# Patient Record
Sex: Female | Born: 2000 | Race: Black or African American | Hispanic: No | Marital: Single | Smoking: Never smoker
Health system: Southern US, Community
[De-identification: ages and names within clinical notes are randomized; demographics above are authoritative.]

---
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
## 2018-08-31 ENCOUNTER — Emergency Department (HOSPITAL_COMMUNITY)
Admission: EM | Admit: 2018-08-31 | Discharge: 2018-08-31 | Disposition: A | Discharge status: Home / Self Care | Payer: Self-pay | Attending: Emergency Medicine | Admitting: Emergency Medicine

## 2018-08-31 ENCOUNTER — Encounter (HOSPITAL_COMMUNITY): Payer: Self-pay | Admitting: Emergency Medicine

## 2018-08-31 ENCOUNTER — Other Ambulatory Visit: Payer: Self-pay

## 2018-08-31 ENCOUNTER — Emergency Department (HOSPITAL_COMMUNITY): Payer: Self-pay

## 2018-08-31 DIAGNOSIS — R590 Localized enlarged lymph nodes: Secondary | ICD-10-CM | POA: Insufficient documentation

## 2018-08-31 DIAGNOSIS — R509 Fever, unspecified: Secondary | ICD-10-CM | POA: Insufficient documentation

## 2018-08-31 DIAGNOSIS — J039 Acute tonsillitis, unspecified: Secondary | ICD-10-CM | POA: Insufficient documentation

## 2018-08-31 LAB — BASIC METABOLIC PANEL
Anion gap: 11 (ref 5–15)
BUN: 13 mg/dL (ref 6–20)
CO2: 19 mmol/L — ABNORMAL LOW (ref 22–32)
Calcium: 9.1 mg/dL (ref 8.9–10.3)
Chloride: 106 mmol/L (ref 98–111)
Creatinine, Ser: 0.97 mg/dL (ref 0.44–1.00)
GFR calc Af Amer: >60 mL/min (ref >60)
GFR calc non Af Amer: >60 mL/min (ref >60)
Glucose, Bld: 111 mg/dL — ABNORMAL HIGH (ref 70–99)
Potassium: 3.8 mmol/L (ref 3.5–5.1)
Sodium: 136 mmol/L (ref 135–145)

## 2018-08-31 LAB — CBC WITH DIFFERENTIAL/PLATELET
Abs Immature Granulocytes: 0.16 10*3/uL — ABNORMAL HIGH (ref 0.00–0.07)
Basophils Absolute: 0 10*3/uL (ref 0.0–0.1)
Basophils Relative: 0 %
Eosinophils Absolute: 0 10*3/uL (ref 0.0–0.5)
Eosinophils Relative: 0 %
HCT: 40.6 % (ref 36.0–46.0)
Hemoglobin: 13.3 g/dL (ref 12.0–15.0)
Immature Granulocytes: 1 %
Lymphocytes Relative: 5 %
Lymphs Abs: 0.7 10*3/uL (ref 0.7–4.0)
MCH: 29.8 pg (ref 26.0–34.0)
MCHC: 32.8 g/dL (ref 30.0–36.0)
MCV: 91 fL (ref 80.0–100.0)
Monocytes Absolute: 1.5 10*3/uL — ABNORMAL HIGH (ref 0.1–1.0)
Monocytes Relative: 10 %
Neutro Abs: 13.3 10*3/uL — ABNORMAL HIGH (ref 1.7–7.7)
Neutrophils Relative %: 84 %
Platelets: 215 10*3/uL (ref 150–400)
RBC: 4.46 MIL/uL (ref 3.87–5.11)
RDW: 11.9 % (ref 11.5–15.5)
WBC: 15.7 10*3/uL — ABNORMAL HIGH (ref 4.0–10.5)
nRBC: 0 % (ref 0.0–0.2)

## 2018-08-31 LAB — I-STAT BETA HCG BLOOD, ED (MC, WL, AP ONLY): I-stat hCG, quantitative: <5 m[IU]/mL (ref <5)

## 2018-08-31 LAB — GROUP A STREP BY PCR: Group A Strep by PCR: NOT DETECTED

## 2018-08-31 LAB — LACTIC ACID, PLASMA: Lactic Acid, Venous: 0.9 mmol/L (ref 0.5–1.9)

## 2018-08-31 MED ORDER — IOHEXOL 300 MG/ML  SOLN
75.0000 mL | Freq: Once | INTRAMUSCULAR | Status: AC
  Administered 2018-08-31: 11:00:00 75 mL via INTRAVENOUS

## 2018-08-31 MED ORDER — KETOROLAC TROMETHAMINE 30 MG/ML IJ SOLN
30.0000 mg | Freq: Once | INTRAMUSCULAR | Status: AC
  Administered 2018-08-31: 10:00:00 30 mg via INTRAVENOUS
  Filled 2018-08-31: qty 1

## 2018-08-31 MED ORDER — ACETAMINOPHEN 500 MG PO TABS
1000.0000 mg | ORAL_TABLET | Freq: Once | ORAL | Status: AC
  Administered 2018-08-31: 09:00:00 1000 mg via ORAL
  Filled 2018-08-31: qty 2

## 2018-08-31 MED ORDER — AMOXICILLIN 500 MG PO CAPS: 500.0000 mg | ORAL_CAPSULE | Freq: Two times a day (BID) | ORAL | 0 refills | Status: AC

## 2018-08-31 MED ORDER — DEXAMETHASONE SODIUM PHOSPHATE 10 MG/ML IJ SOLN
10.0000 mg | Freq: Once | INTRAMUSCULAR | Status: AC
  Administered 2018-08-31: 10:00:00 10 mg via INTRAVENOUS
  Filled 2018-08-31: qty 1

## 2018-08-31 MED ORDER — AMOXICILLIN 500 MG PO CAPS
500.0000 mg | ORAL_CAPSULE | Freq: Once | ORAL | Status: AC
  Administered 2018-08-31: 12:00:00 500 mg via ORAL
  Filled 2018-08-31: qty 1

## 2018-08-31 MED ORDER — SODIUM CHLORIDE 0.9 % IV BOLUS
1000.0000 mL | Freq: Once | INTRAVENOUS | Status: AC
  Administered 2018-08-31: 09:00:00 1000 mL via INTRAVENOUS

## 2018-08-31 MED ORDER — SODIUM CHLORIDE 0.9 % IV BOLUS
1000.0000 mL | Freq: Once | INTRAVENOUS | Status: AC
  Administered 2018-08-31: 10:00:00 1000 mL via INTRAVENOUS

## 2018-08-31 MED ORDER — AMOXICILLIN 500 MG PO CAPS: 500.0000 mg | ORAL_CAPSULE | Freq: Two times a day (BID) | ORAL | 0 refills | Status: DC

## 2018-08-31 NOTE — Discharge Instructions (Addendum)
As we discussed, your CT scan did not show any signs of abscess.  You have tonsillitis with some surrounding reactive swollen lymph nodes.  You can take Tylenol or Ibuprofen as directed for pain. You can alternate Tylenol and Ibuprofen every 4 hours. If you take Tylenol at 1pm, then you can take Ibuprofen at 5pm. Then you can take Tylenol again at 9pm.   Take antibiotics as directed. Please take all of your antibiotics until finished.  Make sure you drink plenty of fluids and stay hydrated.  Please follow-up with primary care doctor or come wellness clinic in 5 to 7 days.  If symptoms do not improve in the next 5 to 7 days, return the emergency department or return sooner for any difficulty breathing, vomiting, high fever despite medications or any other worsening or concerning symptoms.  Return the emergency department for any worsening pain, fever, difficulty breathing or any other worsening or concerning symptoms.

## 2018-08-31 NOTE — ED Provider Notes (Signed)
[facility] Note   CSN: [id number] Arrival date & time: 08/31/18  [id number]     History   Chief Complaint Chief Complaint  Patient presents with   Sore Throat    HPI Tracey Owens is a 18 y.o. female presents for evaluation of 2 days of sore throat.  She started noticing yesterday that she felt like her lymph nodes were swollen.  She states that this was followed by a sore throat.  She states she drank hot tea and took ibuprofen which temporarily relieved her symptoms but states that pain returned worse last night and this morning.  She has not taken any medication since yesterday.  She states that she is able to tolerate her secretions but does report pain when doing so.  She states that she has not been able to eat or drink much secondary to pain.  She has not had any trouble breathing or vomiting.  She states she has not measured her temperature so she does not know if she has had a fever.  He does attend college right now but states she has not had any known sick contacts.  She denies any recent travel or known COVID-19 exposure.  She denies any cough, difficulty breathing, chest pain.     The history is provided by the patient.    History reviewed. No pertinent past medical history.  There are no active problems to display for this patient.   History reviewed. No pertinent surgical history.   OB History   No obstetric history on file.      Home Medications    Prior to Admission medications   Medication Sig Start Date End Date Taking? Authorizing Provider  amoxicillin (AMOXIL) 500 MG capsule Take 1 capsule (500 mg total) by mouth 2 (two) times daily for 7 days. 08/31/18 09/07/18  Maxwell CaulLayden, Nester Bachus A, PA-C  doxycycline (VIBRAMYCIN) 100 MG capsule Take 1 capsule (100 mg total) by mouth 2 (two) times daily. 11/03/16   Roxy HorsemanBrowning, Robert, PA-C  ibuprofen (ADVIL,MOTRIN) 600 MG tablet Take 1 tablet (600 mg total) by mouth every 6 (six)  hours as needed. 08/10/17   Fayrene Helperran, Bowie, PA-C  penicillin v potassium (VEETID) 500 MG tablet Take 1 tablet (500 mg total) by mouth 3 (three) times daily. 08/10/17   Fayrene Helperran, Bowie, PA-C    Family History No family history on file.  Social History Social History   Tobacco Use   Smoking status: Never Smoker   Smokeless tobacco: Never Used  Substance Use Topics   Alcohol use: No   Drug use: No     Allergies   Patient has no known allergies.   Review of Systems Review of Systems  Constitutional: Positive for fever.  HENT: Positive for sore throat. Negative for drooling.   Respiratory: Negative for cough and shortness of breath.   Cardiovascular: Negative for chest pain.  Gastrointestinal: Negative for vomiting.  All other systems reviewed and are negative.    Physical Exam Updated Vital Signs BP 100/64 (BP Location: Left Arm)    Pulse 72    Temp 98.2 F (36.8 C) (Oral)    Resp 14    Ht 5\' 6"  (1.676 m)    Wt 64.9 kg    LMP 08/31/2018 (Exact Date)    SpO2 100%    BMI 23.08 kg/m   Physical Exam Vitals signs and nursing note reviewed.  Constitutional:      Appearance: She is well-developed.  HENT:  Head: Normocephalic and atraumatic.     Mouth/Throat:     Comments: Posterior oropharynx with erythema, edema as well as exudates noted on bilateral tonsils.  Edema noted to bilateral tonsils.  They are not quite kissing but are very swollen.  Uvula appears midline.  No active drooling.  Airway is patent.  She does have some slightly muffled sounds though not quite hot potato voice. Eyes:     General: No scleral icterus.       Right eye: No discharge.        Left eye: No discharge.     Conjunctiva/sclera: Conjunctivae normal.  Pulmonary:     Effort: Pulmonary effort is normal.     Comments: Lungs clear to auscultation bilaterally.  Symmetric chest rise.  No wheezing, rales, rhonchi. Lymphadenopathy:     Cervical: Cervical adenopathy present.  Skin:    General: Skin is  warm and dry.  Neurological:     Mental Status: She is alert.  Psychiatric:        Speech: Speech normal.        Behavior: Behavior normal.      ED Treatments / Results  Labs (all labs ordered are listed, but only abnormal results are displayed) Labs Reviewed  BASIC METABOLIC PANEL - Abnormal; Notable for the following components:      Result Value   CO2 19 (*)    Glucose, Bld 111 (*)    All other components within normal limits  CBC WITH DIFFERENTIAL/PLATELET - Abnormal; Notable for the following components:   WBC 15.7 (*)    Neutro Abs 13.3 (*)    Monocytes Absolute 1.5 (*)    Abs Immature Granulocytes 0.16 (*)    All other components within normal limits  GROUP A STREP BY PCR  LACTIC ACID, PLASMA  I-STAT BETA HCG BLOOD, ED ([facility], [facility], [facility] ONLY)    EKG None  Radiology Ct Soft Tissue Neck W Contrast  Result Date: 08/31/2018 CLINICAL DATA:  Sore throat/stridor, epiglottitis or tonsillitis suspected. Additional history: Swollen lymph nodes in neck with difficulty swallowing today. EXAM: CT NECK WITH CONTRAST TECHNIQUE: Multidetector CT imaging of the neck was performed using the standard protocol following the bolus administration of intravenous contrast. CONTRAST:  45mL OMNIPAQUE IOHEXOL 300 MG/ML  SOLN COMPARISON:  No pertinent prior studies available for comparison. FINDINGS: Pharynx and larynx: The bilateral palatine tonsils are enlarged and heterogeneously enhancing, findings compatible with tonsillitis. No evidence of organized peritonsillar abscess at this time. There is mild-to-moderate narrowing of the oropharyngeal airway. The larynx is unremarkable. Salivary glands: No evidence of inflammation, mass or stone. Thyroid: Unremarkable Lymph nodes: There is bilateral cervical lymphadenopathy. Index left level II lymph nodes measure up to 1.7 cm in short axis. Index right level 2 lymph nodes measure up to 2.1 cm in short axis. Vascular: The carotid and vertebral arteries are  patent within the neck without high-grade stenosis. Limited intracranial: Unremarkable Visualized orbits: The imaged globes and orbits are unremarkable. Mastoids and visualized paranasal sinuses: No significant paranasal sinus disease or mastoid effusion. Skeleton: No acute bony abnormality. Upper chest: The imaged lung apices are clear. IMPRESSION: The bilateral palatine tonsils are enlarged and heterogeneously enhancing, consistent with tonsillitis. Resultant mild to moderate narrowing of the oropharyngeal airway. No evidence of organized peritonsillar abscess at this time. Prominent bilateral cervical lymphadenopathy may be reactive. Clinical follow-up is recommended to ensure resolution and exclude alternative etiologies. Electronically Signed   By: Kellie Simmering   On: 08/31/2018 11:49  Procedures Procedures (including critical care time)  Medications Ordered in ED Medications  sodium chloride 0.9 % bolus 1,000 mL (0 mLs Intravenous Stopped 08/31/18 0942)  acetaminophen (TYLENOL) tablet 1,000 mg (1,000 mg Oral Given 08/31/18 0843)  dexamethasone (DECADRON) injection 10 mg (10 mg Intravenous Given 08/31/18 0953)  ketorolac (TORADOL) 30 MG/ML injection 30 mg (30 mg Intravenous Given 08/31/18 0953)  sodium chloride 0.9 % bolus 1,000 mL (0 mLs Intravenous Stopped 08/31/18 1205)  iohexol (OMNIPAQUE) 300 MG/ML solution 75 mL (75 mLs Intravenous Contrast Given 08/31/18 1108)  amoxicillin (AMOXIL) capsule 500 mg (500 mg Oral Given 08/31/18 1211)     Initial Impression / Assessment and Plan / ED Course  I have reviewed the triage vital signs and the nursing notes.  Pertinent labs & imaging results that were available during my care of the patient were reviewed by me and considered in my medical decision making (see chart for details).        18 y.o. female who presents for evaluation of 2 days of sore throat.  Reports pain with swallowing but able to tolerate secretions.  No vomiting, difficulty  breathing.  On initial ED arrival, she is febrile, tachycardic.  Vitals otherwise stable.  On exam, she does have some cervical lymphadenopathy as well as some erythema, edema, exudates noted posterior oropharynx.  Bilateral tonsils are swollen and are not quite touching.  She does have some muffled voices but not quite hot potato voice.  Question pharyngitis versus early peritonsillar abscess.  I-stat beta negative.  BMP is unremarkable.  Lactic acid is normal.  Strep is negative.  CBC shows leukocytosis of 15.  CT scan shows bilateral palatine tonsils enlarged consistent with tonsillitis.  No evidence of peritonsillar abscess at this time.  Discussed results with patient.  She reports feeling better.  Voice sounds less muffled.  Patient with no trouble breathing.  Vitals are stable.  Plan to p.o. challenge.  Patient able to tolerate p.o. without any difficulty.  Given fever, extensive plan to treat with amoxicillin.  Patient no known drug allergies. At this time, patient exhibits no emergent life-threatening condition that require further evaluation in ED or admission. Patient had ample opportunity for questions and discussion. All patient's questions were answered with full understanding. Strict return precautions discussed. Patient expresses understanding and agreement to plan.    Portions of this note were generated with Scientist, clinical (histocompatibility and immunogenetics)Dragon dictation software. Dictation errors may occur despite best attempts at proofreading.   Final Clinical Impressions(s) / ED Diagnoses   Final diagnoses:  Tonsillitis    ED Discharge Orders         Ordered    amoxicillin (AMOXIL) 500 MG capsule  2 times daily,   Status:  Discontinued     08/31/18 1205    amoxicillin (AMOXIL) 500 MG capsule  2 times daily     08/31/18 1207           Maxwell CaulLayden, Draper Gallon A, PA-C 08/31/18 1501    Sabas SousBero, Michael M, MD 09/03/18 1106

## 2018-08-31 NOTE — ED Triage Notes (Signed)
Pt states her throat started hurting yesterday. This morning she woke up and it was very painful to swallow. Redness and white pockets noted on back of throat. Pt took Ibuprofen yesterday with no relief. Pain 7/10

## 2018-08-31 NOTE — ED Notes (Signed)
Pt transported to CT ?

## 2018-08-31 NOTE — ED Notes (Signed)
Patient verbalizes understanding of discharge instructions. Opportunity for questioning and answers were provided. Pt discharged from ED. 

## 2020-08-12 IMAGING — CT CT NECK WITH CONTRAST
4 of 5 series · 16 of 33 positions shown, 18 images · IV contrast (omnipaque)
Comparison: No pertinent prior studies available for comparison.

CLINICAL DATA: Sore throat/stridor, epiglottitis or tonsillitis
suspected. Additional history: Swollen lymph nodes in neck with
difficulty swallowing today.

EXAM:
CT NECK WITH CONTRAST
TECHNIQUE: Multidetector CT imaging of the neck was performed using the
standard protocol following the bolus administration of intravenous
contrast.
CONTRAST:  75mL OMNIPAQUE IOHEXOL 300 MG/ML  SOLN

[Series 3: neck 2.0 st · axial · 0.48mm/px · z∈[-228,-72]mm · 4 of 130 slices shown, 5 images (1 of 3)]
[im 26/130  soft-tissue]
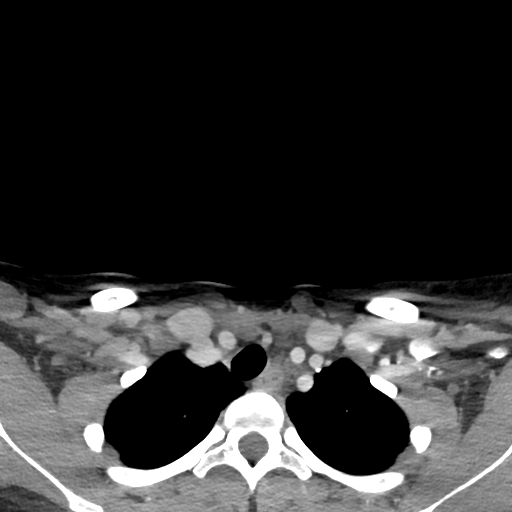
[im 26/130  bone]
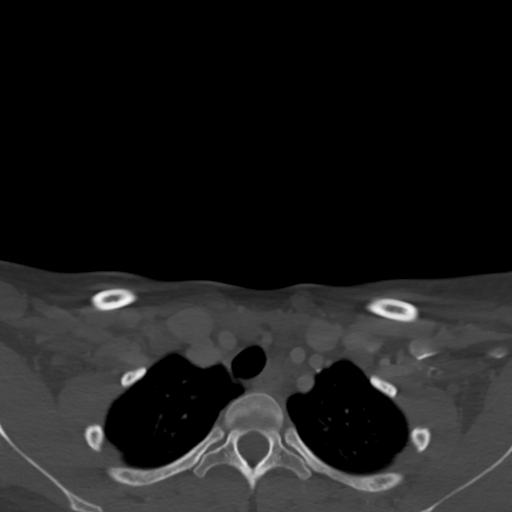
[im 52/130  bone]
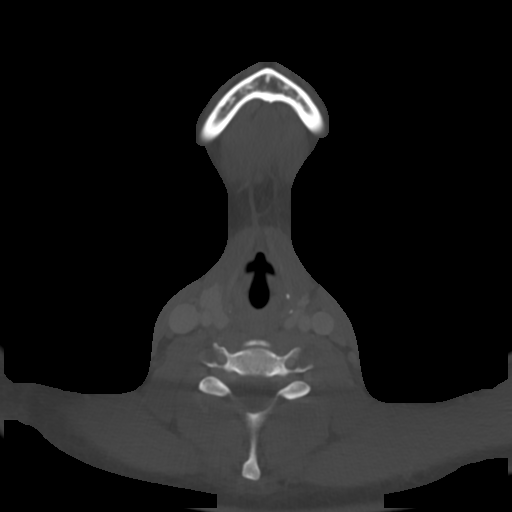
[im 78/130  bone]
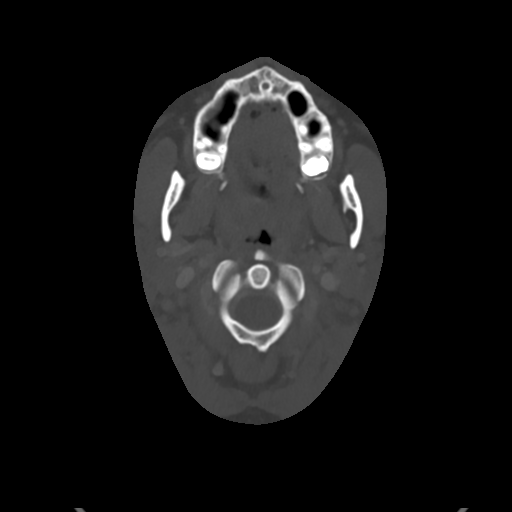
[im 104/130  bone]
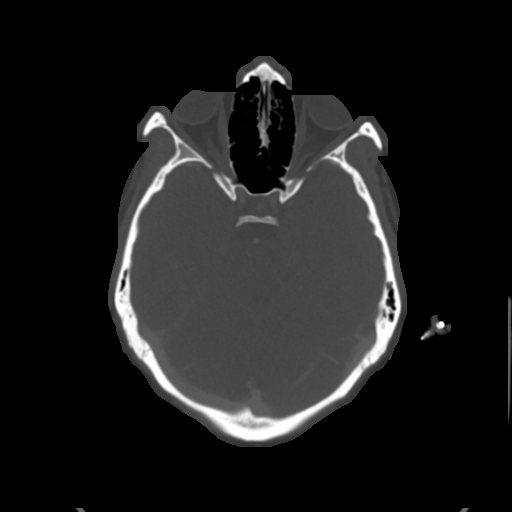

[Series 5: neck 2.0 st · sagittal · 0.51mm/px · 5 of 101 slices shown, 6 images (2 of 3)]
[im 34/101  bone]
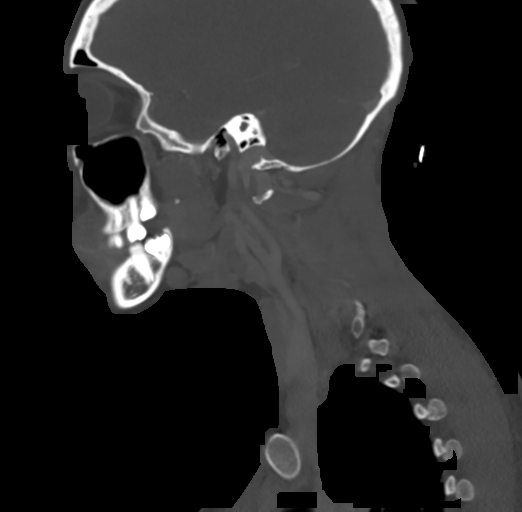
[im 42/101  bone]
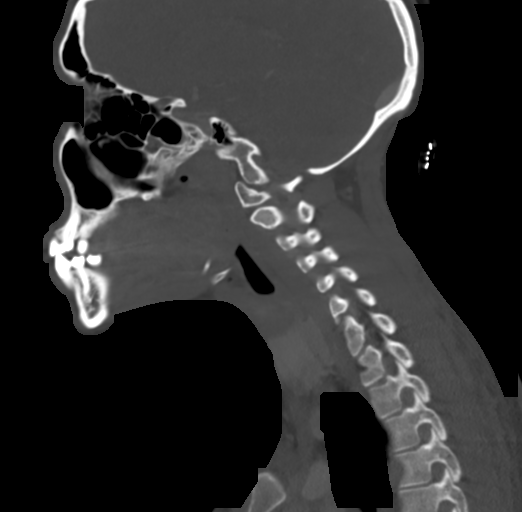
[im 51/101  soft-tissue]
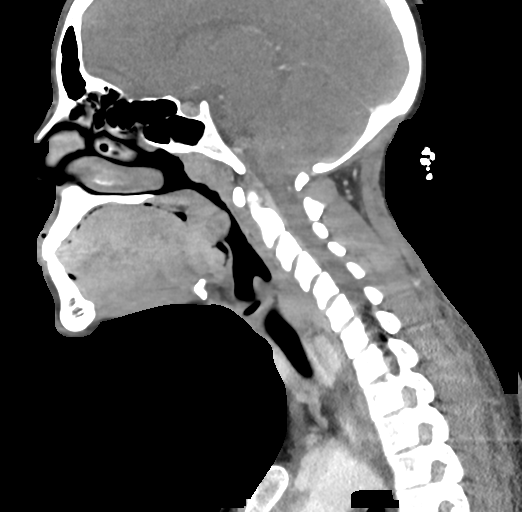
[im 51/101  bone]
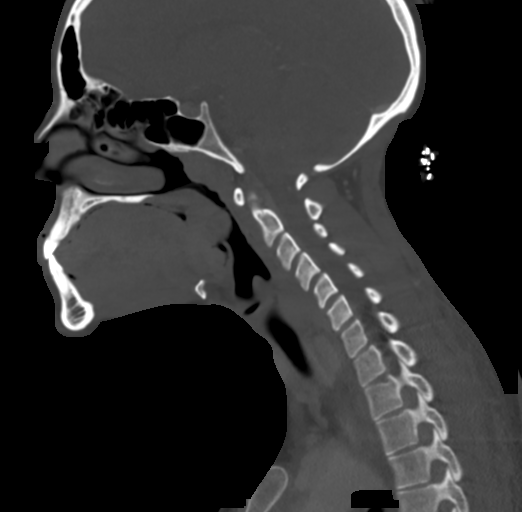
[im 59/101  bone]
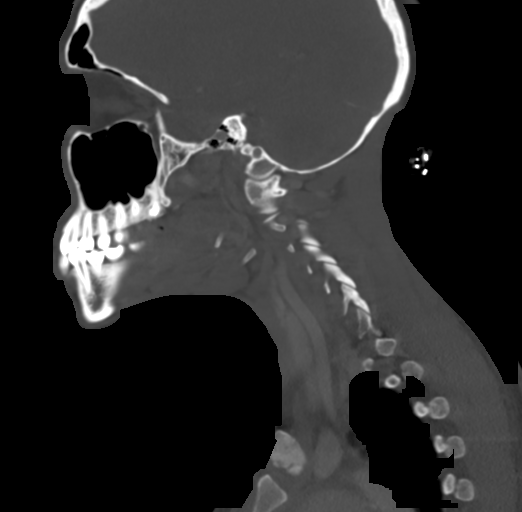
[im 67/101  bone]
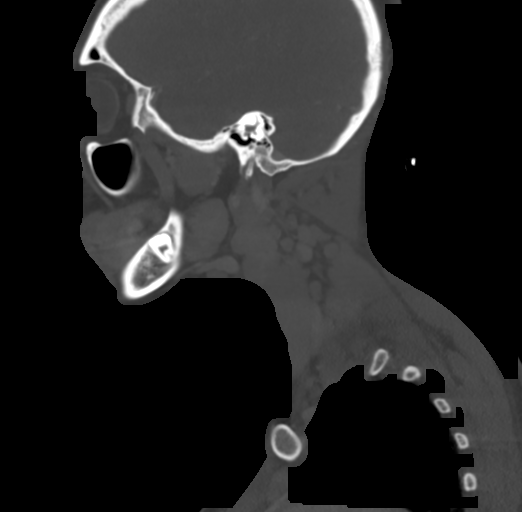

[Series 6: neck 2.0 st · coronal · 0.51mm/px · 3 of 126 slices shown (3 of 3)]
[im 26/126  bone]
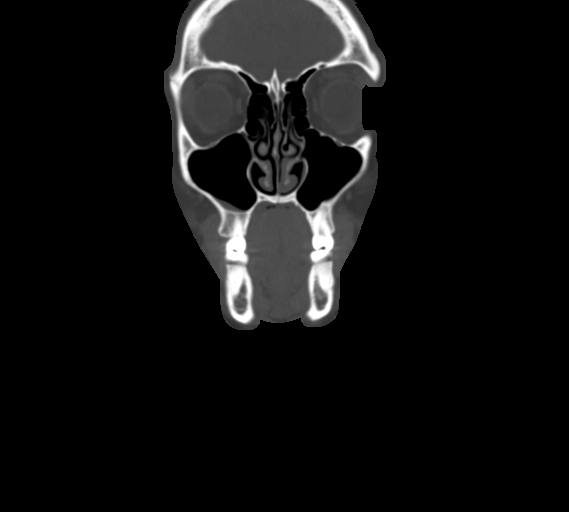
[im 51/126  bone]
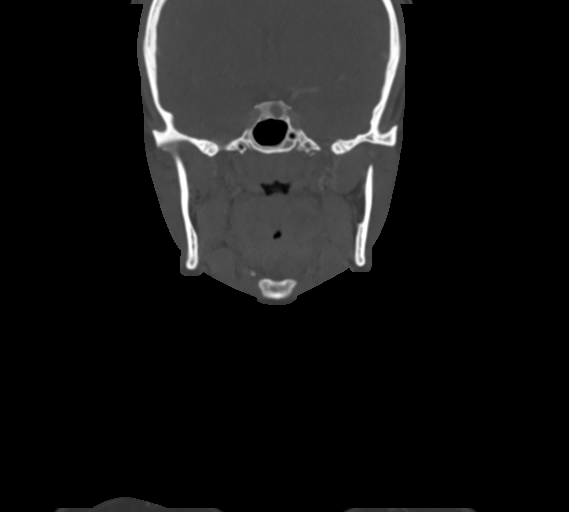
[im 76/126  bone]
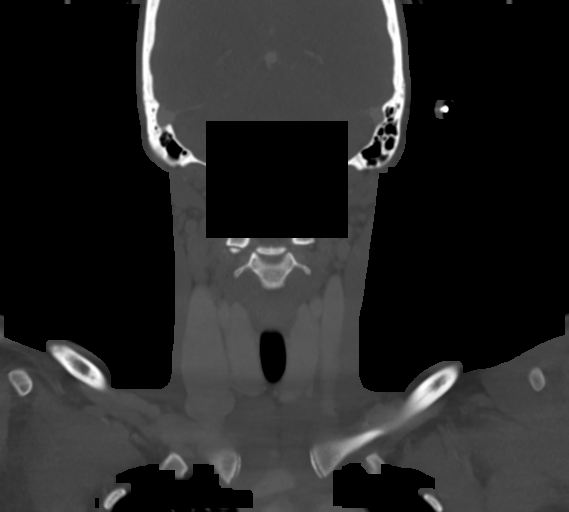

[Series 7: neck 2.0 st orthogonal · axial · 0.39mm/px · z∈[-228,-72]mm · 4 of 130 slices shown]
[im 26/130  bone]
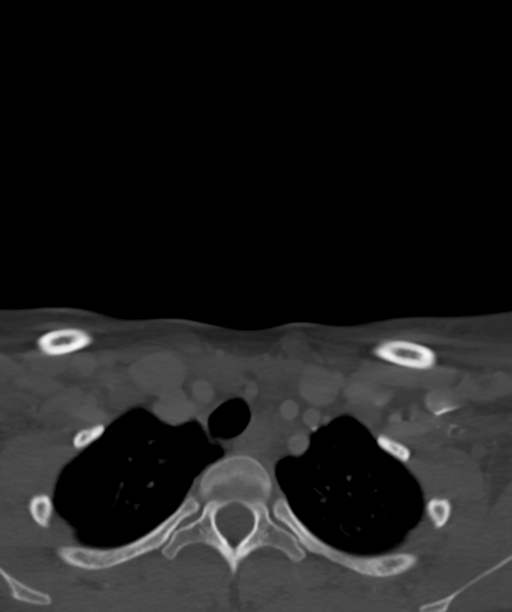
[im 52/130  bone]
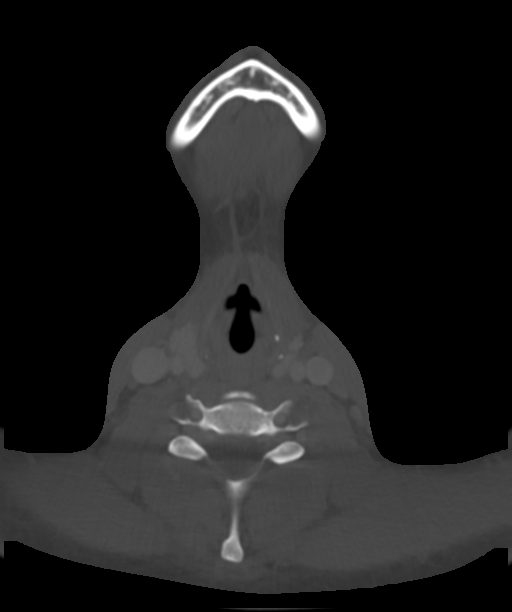
[im 78/130  bone]
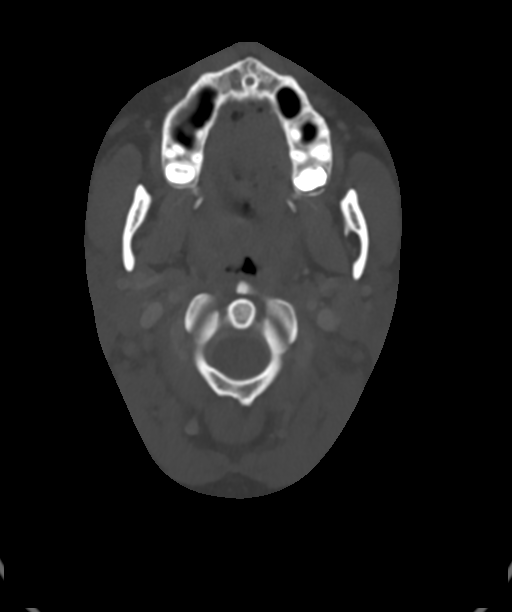
[im 104/130  bone]
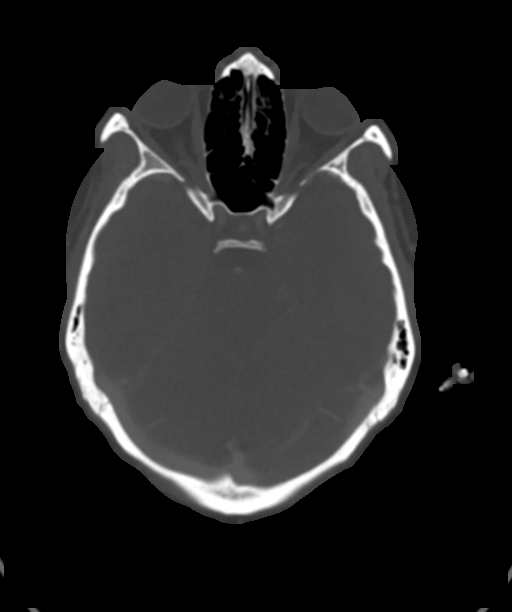

[16 of 33 positions shown; findings below may reference images not displayed]

FINDINGS: Pharynx and larynx: The bilateral palatine tonsils are enlarged and
heterogeneously enhancing, findings compatible with tonsillitis. No
evidence of organized peritonsillar abscess at this time. There is
mild-to-moderate narrowing of the oropharyngeal airway. The larynx
is unremarkable.

Salivary glands: No evidence of inflammation, mass or stone.

Thyroid: Unremarkable

Lymph nodes: There is bilateral cervical lymphadenopathy. Index left
level II lymph nodes measure up to 1.7 cm in short axis. Index right
level 2 lymph nodes measure up to 2.1 cm in short axis.

Vascular: The carotid and vertebral arteries are patent within the
neck without high-grade stenosis.

Limited intracranial: Unremarkable

Visualized orbits: The imaged globes and orbits are unremarkable.

Mastoids and visualized paranasal sinuses: No significant paranasal
sinus disease or mastoid effusion.

Skeleton: No acute bony abnormality.

Upper chest: The imaged lung apices are clear.
IMPRESSION: The bilateral palatine tonsils are enlarged and heterogeneously
enhancing, consistent with tonsillitis. Resultant mild to moderate
narrowing of the oropharyngeal airway. No evidence of organized
peritonsillar abscess at this time.

Prominent bilateral cervical lymphadenopathy may be reactive.
Clinical follow-up is recommended to ensure resolution and exclude
alternative etiologies.
